# Patient Record
Sex: Female | Born: 1945 | Race: White | Hispanic: No | Marital: Married
Health system: Southern US, Community
[De-identification: ages and names within clinical notes are randomized; demographics above are authoritative.]

---

## 1998-08-25 ENCOUNTER — Ambulatory Visit (HOSPITAL_BASED_OUTPATIENT_CLINIC_OR_DEPARTMENT_OTHER): Admission: RE | Admit: 1998-08-25 | Discharge: 1998-08-25 | Payer: Self-pay | Admitting: Orthopedic Surgery

## 2006-09-02 ENCOUNTER — Inpatient Hospital Stay (HOSPITAL_COMMUNITY): Admission: RE | Admit: 2006-09-02 | Discharge: 2006-09-07 | Payer: Self-pay | Admitting: Orthopedic Surgery

## 2006-11-29 ENCOUNTER — Inpatient Hospital Stay (HOSPITAL_COMMUNITY): Admission: RE | Admit: 2006-11-29 | Discharge: 2006-12-03 | Payer: Self-pay | Admitting: Orthopedic Surgery

## 2007-01-30 ENCOUNTER — Ambulatory Visit (HOSPITAL_COMMUNITY): Admission: RE | Admit: 2007-01-30 | Discharge: 2007-01-31 | Payer: Self-pay | Admitting: Orthopedic Surgery

## 2008-04-12 IMAGING — CR DG CHEST 2V
2 series · 2 of 2 positions shown · non-contrast
Comparison: None.

CLINICAL DATA: Osteoarthritis, left knee.  Preoperative respiratory exam.
 CHEST ? 2 VIEW:

[w chest pa *]
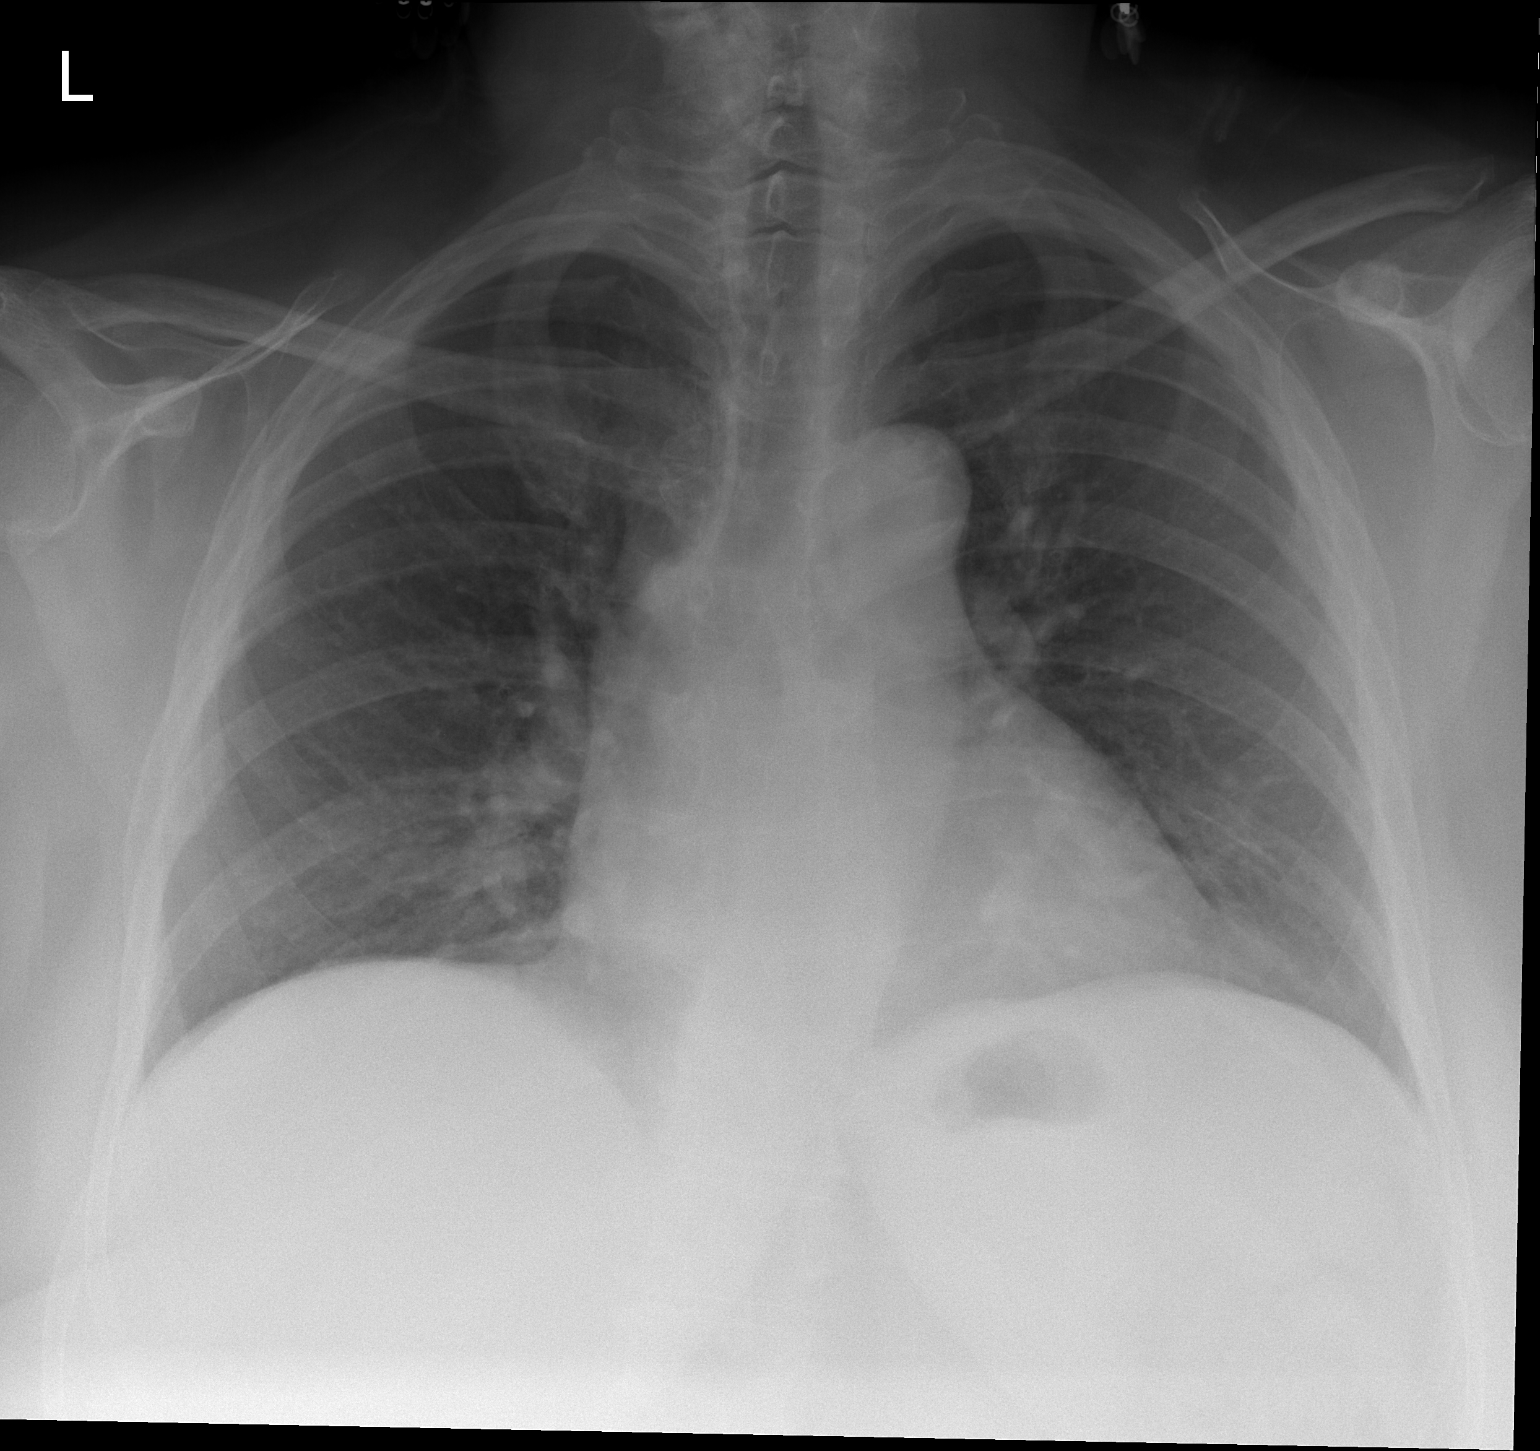

[w chest lat *]
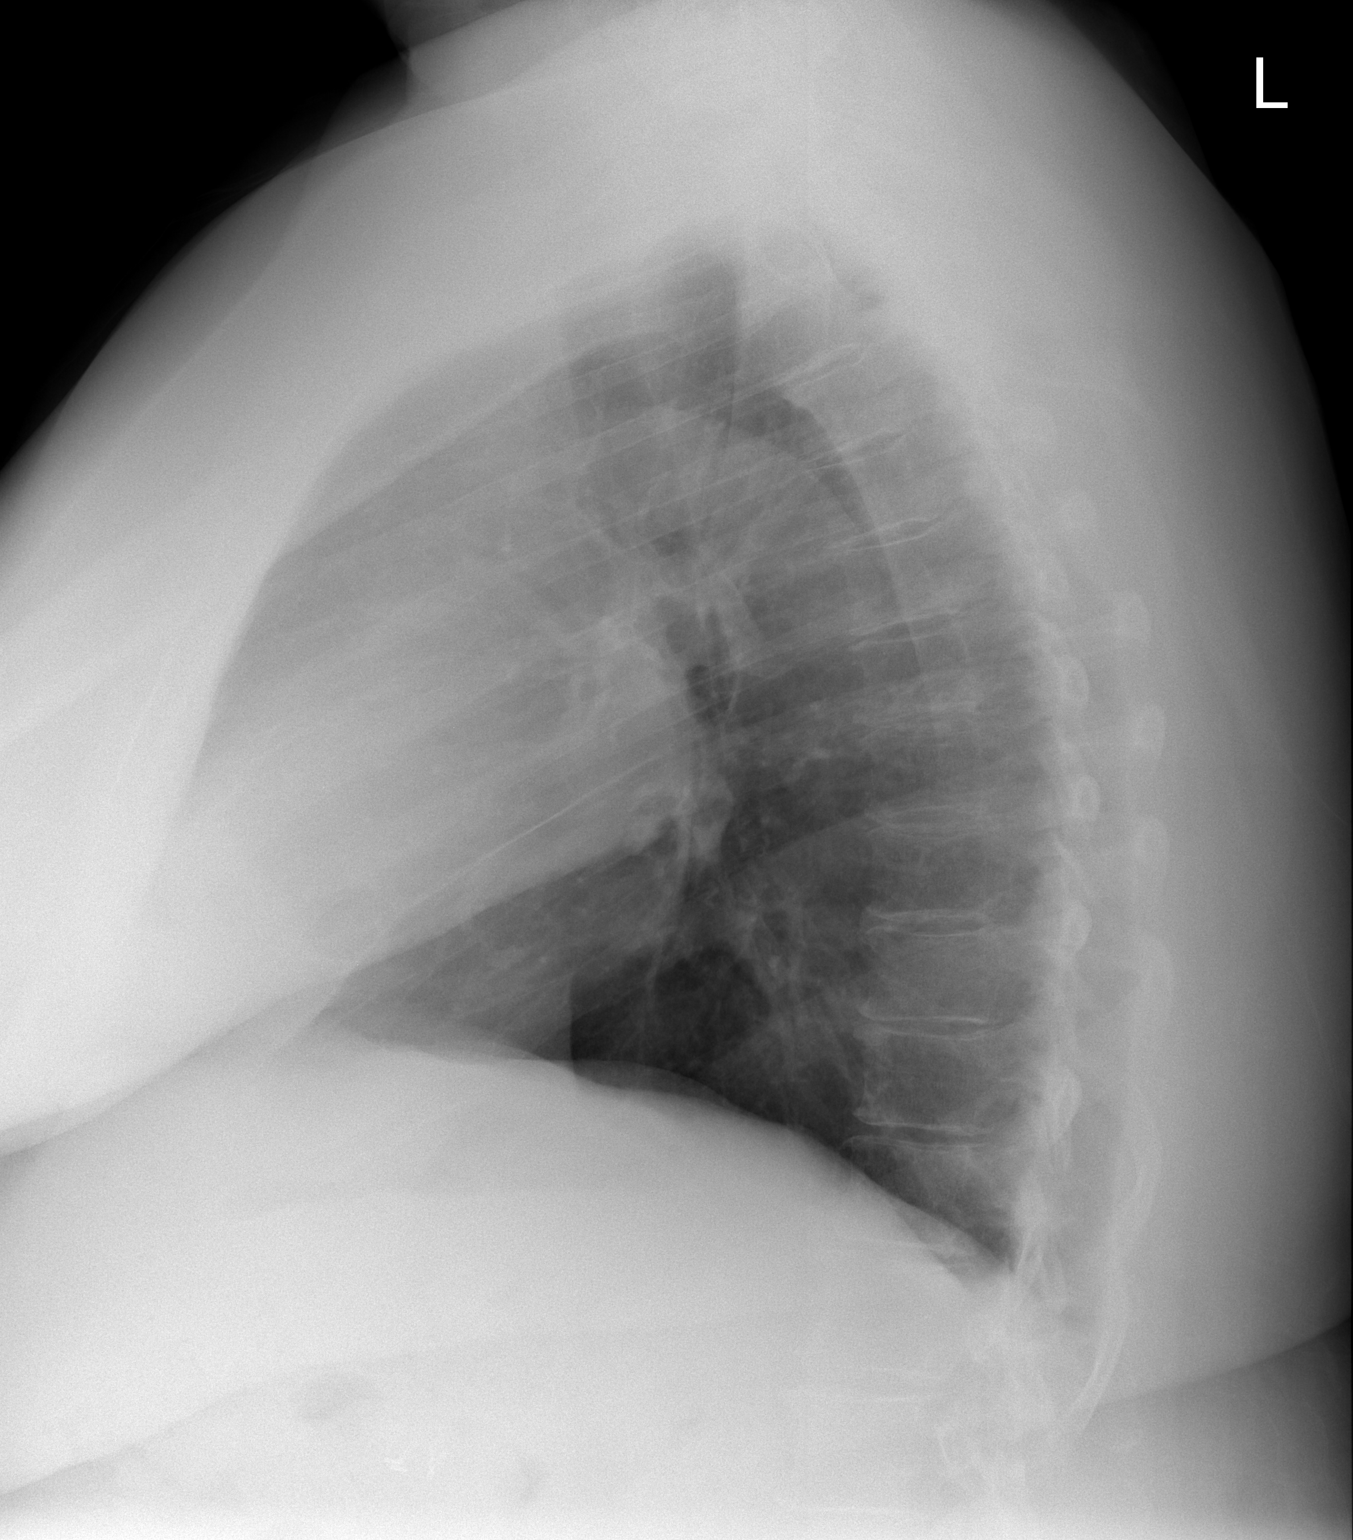

[2 of 2 positions shown; findings below may reference images not displayed]

FINDINGS: Heart size is enlarged.  There is no effusion or edema.  No airspace opacities are identified.  
 The trachea is midline.
IMPRESSION: 1. No active disease.
 2. Cardiac enlargement without failure.

## 2010-11-21 NOTE — H&P (Signed)
NAME:  [name], [name]                 ACCOUNT NO.:  [id number]   MEDICAL RECORD NO.:  [id number]          PATIENT TYPE:  INP   LOCATION:  0001                         FACILITY:  [facility]   PHYSICIAN:  Ollen Gross, M.D.    DATE OF BIRTH:  1945-08-17   DATE OF ADMISSION:  11/29/2006  DATE OF DISCHARGE:                              HISTORY & PHYSICAL   DATE OF OFFICE VISIT, HISTORY AND PHYSICAL:  Nov 14, 2006.   CHIEF COMPLAINT:  Right knee pain.   HISTORY OF PRESENT ILLNESS:  The patient is 65 year old female who been  seen by Dr. Lequita Halt for bilateral knee pain.  She previously underwent a  left total knee arthroplasty back in February of this year.  She has  recovered fairly well from her left knee but her right knee has  continued to progress in pain.  It is felt she would benefit from  undergoing replacement.  Risks and benefits have been discussed.  She  elects to proceed with surgery.   ALLERGIES:  TETRACYCLINE and LIPITOR.   CURRENT MEDICATIONS:  Nexium, Paxil, levothyroxine, clonazepam,  Actos/metformin, methocarbamol, OxyContin, and Lorcet.   PAST MEDICAL HISTORY:  1. Depression.  2. Arthritis.  3. Hiatal hernia.  4. Reflux disease.  5. History of bronchitis.  6. History of UTIs.  7. Hypercholesterolemia.  8. Non-insulin-dependent diabetes mellitus.  9. Hypothyroidism.  10.History of uterine fibroids.  11.History of anemia.  12.History of postop blood loss anemia requiring transfusion from      previous abdominal surgery.  13.History of cervical radiculopathy.  14.History of cystitis.  15.History of fibromyalgia.  16.History of carpal tunnel syndrome.  17.Cervical degenerative disk disease.  18.Lumbar degenerative disk disease.   PAST SURGICAL HISTORY:  1. Left total knee in February 2008 per Dr. Lequita Halt.  2. Appendectomy.  3. Rotator cuff repair.  4. Gallbladder surgery.  5. She has undergone multiple abdominal surgeries including ventral      hernia repair  and the removal of mesh after it broke for a total of      four abdominal procedures.   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  Three  children.   FAMILY HISTORY:  Mother with a history of stroke and diabetes.  Father  with a history of heart disease, blood clots and diabetes.   REVIEW OF SYSTEMS:  GENERAL:  No fevers, chills or night sweats.  NEUROLOGIC:  No seizures, syncope or paralysis.  RESPIRATORY:  No  shortness of breath, productive cough or hemoptysis.  CARDIOVASCULAR:  No chest pain, angina or orthopnea.  GI: History of reflux, hiatal  hernia.  No no nausea, vomiting, diarrhea or constipation.  GU:  History  of UTIs and cystitis.  History of frequency and nocturia.  MUSCULOSKELETAL:  Right knee.   PHYSICAL EXAMINATION:  VITAL SIGNS:  Pulse 74, respirations 14, blood  pressure 140/86.  GENERAL:  A 65 year old white female, well-developed, overweight, obese,  no acute distress, alert and oriented and cooperative.  HEENT:  Normocephalic, atraumatic.  Pupils equal, round and reactive.  Oropharynx clear.  EOMs intact.  NECK:  Supple.  CHEST:  Anterior and posterior chest walls clear.  No rhonchi, rales, or  wheezing.  HEART:  Regular rate and rhythm.  Grade 2/6 systolic ejection murmur,  S1, S2 noted.  ABDOMEN:  Soft, round, abdominal pannus.  Multiple abdominal scars with  ventral hernia.  RECTAL, BREASTS, GENITALIA:  Not done, not pertinent to present illness.  EXTREMITIES:  Right knee:  Range of motion 10-120.  Marked crepitus.  Tender more medial than lateral.   IMPRESSION:  1. Osteoarthritis, right knee.  2. Depression.  3. Arthritis.  4. Hiatal hernia.  5. Reflux disease.  6. History of bronchitis.  7. History of urinary tract infections.  8. Hypercholesterolemia.  9. Non-insulin-dependent diabetes mellitus.  10.Hypothyroidism.  11.History of uterine fibroids.  12.History of anemia.  13.History of blood loss anemia requiring transfusion  following      previous abdominal surgeries.  14.History of cervical radiculopathy.  15.History of cystitis.  16.History of fibromyalgia.  17.History of carpal tunnel syndrome.  18.Cervical degenerative disk disease.  19.Lumbar degenerative disk disease.   PLAN:  The patient will be admitted to [facility] to undergo a  right total knee replacement arthroplasty.  Surgery will be performed by  Dr. Ollen Gross.      Alexzandrew L. Perkins, P.A.C.      Ollen Gross, M.D.  Electronically Signed    ALP/MEDQ  D:  11/28/2006  T:  11/29/2006  Job:  [id number]   cc:   Cathleen Corti. Percell Boston, MD  [facility]  [place]

## 2010-11-21 NOTE — Op Note (Signed)
NAME:  Laprise, Yu                 ACCOUNT NO.:  [id number]   MEDICAL RECORD NO.:  [id number]          PATIENT TYPE:  INP   LOCATION:  0001                         FACILITY:  [facility]   PHYSICIAN:  Ollen Gross, M.D.    DATE OF BIRTH:  March 25, 1946   DATE OF PROCEDURE:  DATE OF DISCHARGE:                               OPERATIVE REPORT   PREOPERATIVE DIAGNOSIS:  Osteoarthritis of the right knee.   POSTOPERATIVE DIAGNOSIS:  Osteoarthritis of the right knee.   PROCEDURE:  Right total knee arthroplasty.   ASSISTANT:  Avel Peace, PA-C.   ANESTHESIA:  General with postop Marcaine pain pump.   ESTIMATED BLOOD LOSS:  Minimal.   DRAINS:  None.   TOURNIQUET TIME:  38 minutes at 350 mmHg.   COMPLICATIONS:  None.   CONDITION:  Stable.   BRIEF CLINICAL NOTE:  Ms. [name] [name] is a 65 year old female with end-stage  arthritis of the right knee.  She has failed nonoperative management.  She had a successful left total knee arthroplasty and presents for right  total knee arthroplasty.   PROCEDURE IN DETAIL:  Successful administration of general anesthetic.  A tourniquet was placed on the right thigh and the right lower extremity  prepped and draped in the usual sterile fashion.  The extremity was  wrapped in Esmarch, the knee flexed and the tourniquet inflated to 350  mmHg.  A midline incision made with a 10-blade through the subcutaneous  tissue to the level of the extensor mechanism.  A fresh blade was used  to make a medial parapatellar arthrotomy.  The soft tissue over the  proximal medial tibia was subperiosteally elevated to the joint line  with the knife and into the semimembranosus bursa with a Cobb elevator.  The soft tissue laterally was elevated with attention being paid to  avoid the patellar tendon on a tibial tubercle.  The patella subluxed  laterally and the knee flexed 90 degrees.  The ACL and PCL were removed.  The drill was used to create a starting hole in the distal femur  and the  canal was thoroughly irrigated.  A 5-degree and right valgus alignment  guide was placed.  Referencing off the posterior condyles, rotation was  marked and a block pinned to remove 10 mm off the distal femur.  A  distal femoral resection was made with an oscillating saw.  A sizing  block was placed; a size 2.5 was most appropriate.  The rotation was  marked at the epicondylar axis.  A size-2.5 cutting block was placed and  the anterior-posterior chamfer cuts made.   The tibia was subluxed forward and the menisci removed.  Extramedullary  tibial alignment guides were placed, referencing proximally at the  medial aspect of the tibial tubercle and distally along the 2nd  metatarsal axis and tibial crest.  The block was pinned to remove 2 mm  off the more deficient lateral side.  A tibial resection was made with  an oscillating saw.  The tibia was a size 2.5 also and the proximal  tibia was repaired with a modular drill and  keel punch for a size 2.5.  The femoral preparation was completed with the intercondylar cut.   A size-2.5, mobile-bearing tibial trial and size-2.5, posterior-  stabilized femoral trial and size-12.5, posterior-stabilizing, rotating-  platform insert trial were placed.  The 12.5 still had a little bit of  laxity and we went to a 15 which allowed for full extension with  excellent varus and valgus balance throughout full range of motion.  The  patella was then everted and the thickness measured to be 21 mm.  Freehand resection was taken to 13 mm, a 35-template was placed, lug  holes were drilled, and a trial patella was placed and it tracked  normally.  Osteophytes were removed off the posterior femur with the  trial in place.  All the trials were removed and the cut bone surfaces  prepared with pulsatile lavage.  The cement was mixed, and once ready  for implantation, the size-2.5, mobile-bearing tibial tray, size-2.5,  posterior-stabilized femur and size-35  patella were cemented into place  and the patella was held a clamp.  A trial 15-mm insert was placed, the  knee held in full extension and all extruded cement removed.  The joint  was then thoroughly irrigated with saline solution.  When the cement is  fully hardened, then the trial was removed and the FloSeal injected onto  the posterior capsule.  The permanent, 15-mm, posterior-stabilizer,  rotating-platform insert was placed.  The knee was then held in full  extension.  The remainder of FloSeal was injected into the suprapatellar  area in the medial and lateral gutters.  The tourniquet was loosened for  a total time of 38 minutes.  Pressure was held for about a minute and  then the knee inspected and minimal bleeding was encountered.  We  thoroughly irrigated again, and then the arthrotomy was closed with  interrupted #-PDS.  Flexion against gravity was 135 degrees.  The subcu  was closed with interrupted 2-0 Vicryl and subcuticular running 4-0  Monocryl.  A catheter for the Marcaine pain pump was placed and the pump  was initiated.  Steri-Strips and a bulky sterile dressing are applied.  She was placed into a knee immobilizer, awakened and transported to  recovery in stable condition.      Ollen Gross, M.D.  Electronically Signed     FA/MEDQ  D:  11/29/2006  T:  11/29/2006  Job:  [id number]

## 2010-11-21 NOTE — Op Note (Signed)
NAME:  [name] [name], [name] [name]                 ACCOUNT NO.:  [id number]   MEDICAL RECORD NO.:  [id number]          PATIENT TYPE:  OIB   LOCATION:  5036                         FACILITY:  [facility]   PHYSICIAN:  Vania Rea. Supple, M.D.  DATE OF BIRTH:  1946-06-11   DATE OF PROCEDURE:  01/30/2007  DATE OF DISCHARGE:                               OPERATIVE REPORT   PREOPERATIVE DIAGNOSES:  1. Chronic large right shoulder rotator cuff tear.  2. Right shoulder acromioclavicular joint arthropathy.   POSTOPERATIVE DIAGNOSES:  1. Chronic massive right shoulder rotator cuff tear.  2. Right shoulder acromioclavicular joint arthropathy.  3. Right shoulder biceps tendon tear.  4. Complex and extensive labral tear.   PROCEDURE:  1. Right shoulder examination under anesthesia.  2. Right shoulder diagnostic arthroscopy.  3. Biceps tendon tenotomy.  4. Labral debridement.  5. Arthroscopic subacromial decompression bursectomy.  6. Arthroscopic distal clavicle resection.  7. Arthroscopic rotator cuff repair.  This performed using a double      row suture bridge repair construct and two bio corkscrew anchors      and two 5-0 swivel lock suture anchors.   SURGEON OF RECORD:  Vania Rea. Supple, M.D.   ASSISTANTFrench Ana Shuford PA-C.   ANESTHESIA:  General endotracheal.   ESTIMATED BLOOD LOSS:  Minimal.   COMPLICATIONS:  None.   HISTORY:  [name] is a 65 year old female who has had chronic right  shoulder pain as well as weakness and restrictions in shoulder mobility  with an MRI scan from a year ago showing a relatively large and  moderately retracted tear of the rotator cuff.  Due to her ongoing pain  and functional limitations, she is brought to the operating at this time  for planned right shoulder arthroscopy with rotator repair as indicated.   We counseled [name] on treatment options as well as risks versus  benefits thereof.  Possible surgical complications including infection,  neurovascular  injury, persistent pain, loss of motion, recurrence  rotator cuff tear and possible need for additional surgery were  reviewed.  She understands and accepts and agrees for planned procedure.   Initially [name] had been scheduled for a hemiarthroplasty the  shoulder due to a presumptive rotator cuff tear arthropathy.  However on  reviewing her history as well as studies and reexamination of the  shoulder, it was felt as though her overall shoulder alignment was  relatively good and that she would be a good candidate for an attempt at  repair of the rotator cuff as opposed to proceeding with a  hemiarthroplasty at this stage.  I reviewed these issues great length  with [name]  preoperatively and she understood and accepted and  agreed with our plan for arthroscopic repair is indicated.   PROCEDURE IN DETAIL:  After undergoing routine preop evaluation, the  patient received prophylactic antibiotics.  Brought to the operating  room, placed supine on the operating table and underwent smooth  induction of a general endotracheal anesthesia.  Turned to the left  lateral decubitus position on the beanbag and appropriately padded and  protected.  Right shoulder examination under anesthesia revealed full  passive motion.  No obvious instability patterns.  Right arm suspended  in 70 degrees of abduction with 10 pounds traction.  The right shoulder  girdle region was then sterilely prepped and draped in standard fashion.  Posterior portal established in the glenohumeral joint and anterior  portal established under direct visualization.  The relatively large  rotator cuff tear made it such that these portals actually visualized  the glenohumeral joint through the cuff tear.  The tear involved the  entire supra and infraspinatus and this was retracted almost 3 cm.  It  was not all the way back to the glenoid, however.  It was quite broad  however, being almost 5 cm in length across the apex  of the greater  tuberosity.  The biceps tendon showed severe degeneration and fraying of  the intra-articular portion and with this finding, we went ahead and  performed a biceps tenotomy dividing the biceps tendon.  We then found a  large degenerative tear of the superior half of the labrum. We performed  a labral debridement.  The articular surfaces were in good condition.  We then mobilized the rotator cuff and performed dissection on both  bursal and articular sides and then confirmed the rotator cuff was  properly mobile and had good elasticity and it did appear to be  repairable injury.  We then performed subacromial bursectomy and lysis  of adhesions and then our bur was introduced to perform subacromial  decompression creating type 1 morphology.  Portal was then established  anterior to the distal clavicle and distal clavicle resection performed  with a bur.  Care was taken to make sure the entire circumference of the  distal clavicle could be visualized to ensure adequate removal of bone.  An accessory portal device was then established.  Through the stab  wounds on lateral margin of the acromion placed an Arthrex bio corkscrew  suture anchor x2 and the free limbs of the suture anchor then passed to  adjacent margin of the rotator cuff in a horizontal mattress construct  and this series of suture limbs was then tied sequentially from  posterior to anterior with a series of sliding locking knots followed by  multiple overhand throws on alternating posts.  This allowed excellent  reapposition of rotator cuff edge against the bony bed tuberosity.  I  should mention that we prepared the tuberosity by removing the residual  soft tissue and then gently abrading the bone.  Once the sutures had  been tied we then created a suture bridge utilizing two Arthrex bio  swivel lock suture anchors passing the suture limbs through these  anchors and nicely compressing the repair and adding  stability.  Suture  limbs were then clipped.  At completion the rotator cuff margin was  nicely compressed against the bony bed on the tuberosity.  This point we  confirmed appropriate decompression and bursectomy.  Fluid and  instruments were removed.  Portals closed with Monocryl and Steri-  Strips.  A bulky dry dressing was then taped to right shoulder and right  arm was placed in sling immobilizer.  The patient then rolled supine,  extubated and taken to the recovery room in stable condition.      Vania Rea. Supple, M.D.  Electronically Signed     KMS/MEDQ  D:  01/30/2007  T:  01/31/2007  Job:  [id number]

## 2010-11-24 NOTE — H&P (Signed)
NAME:  [name] [name], [name] [name]                 ACCOUNT NO.:  [id number]   MEDICAL RECORD NO.:  [id number]          PATIENT TYPE:  INP   LOCATION:  NA                           FACILITY:  [facility]   PHYSICIAN:  Ollen Gross, M.D.    DATE OF BIRTH:  10/19/1945   DATE OF ADMISSION:  09/02/2006  DATE OF DISCHARGE:                              HISTORY & PHYSICAL   Date of office visit, history and physical August 27, 2006.   CHIEF COMPLAINT:  Bilateral knee pain, left greater than right.   HISTORY OF PRESENT ILLNESS:  The patient is a 65 year old female who has  been seen by Dr. Lequita Halt for bilateral knee pain, the left is greater  than the right.  She is seen in second opinion with a longstanding  history of arthritis and pain in both knees.  She was then treated in  the past conservatively including with multiple injections including  cortisone, Synvisc and Hyalgan.  Unfortunately, nothing has helped.  She  has been seen originally in [place] and recommended bilateral total  knee arthroplasty.  She has some good friends in [place] and was  referred over to Dr. Lequita Halt.  She has been seen by Dr. Lequita Halt and  found to have severe end-stage tricompartmental arthritis in the left  knee, bone-on-bone arthritis compartments and in the right knee very  significant arthritis tricompartmental but to a lesser degree.  It is  felt due to the end-stage arthritis and findings that she would benefit  from undergoing knee replacements.  It is felt that due to her ongoing  medical problems that it is much safer to do one at a time.  This is  discussed at length with her.  Risks and benefits have been discussed  and she has elected to proceed with surgery.  She was subsequently  admitted to the hospital.   ALLERGIES:  1. TETRACYCLINE.  2. LIPITOR.   CURRENT MEDICATIONS:  Sulfa, Flexeril, metformin, Actos, clonazepam,  Prevacid, Xanax, Paxil, OxyContin, Benadryl, Mobic, thyroid supplement  and  Lorcet.   PAST MEDICAL HISTORY:  1. Depression.  2. Arthritis.  3. Hiatal hernia.  4. Reflux disease.  5. History of UTIs.  6. Hypercholesterolemia.  7. Noninsulin-dependent diabetes mellitus.  8. Hypothyroidism.  9. History of uterine fibroids.  10.History of anemia.  11.History of postoperative blood loss anemia requiring transfusion      following abdominal surgery.  12.History of cervical radiculopathy.  13.History of cystitis.  14.History of fibromyalgia.  15.History of carpal tunnel syndrome.  16.Cervical degenerative disc disease.  17.Lumbar degenerative disc disease.   PAST SURGICAL HISTORY:  1. Appendectomy.  2. Rotator cuff repair.  3. Gallbladder surgery.  4. She has undergone 4 abdominal surgeries including ventral hernia      repair and then removal of mesh after it broke and has had 4      surgeries total.   SOCIAL HISTORY:  Married, housewife, nonsmoker, no alcohol, 3 children.   FAMILY HISTORY:  Mother with history of stroke and diabetes.  Father  with a history of heart disease, blood clots and diabetes.  REVIEW OF SYSTEMS:  GENERAL:  No fevers, chills, night sweats.  NEUROLOGICAL:  She does have cervical radiculopathy.  No seizures,  syncope, paralysis.  RESPIRATORY:  No shortness of breath, productive  cough or hemoptysis.  CARDIOVASCULAR:  No chest pain, angina or  orthopnea.  GI:  She does have constipation with reflux disease.  No  nausea, vomiting, diarrhea.  GU:  History of UTIs, cystitis, some  urinary frequency and nocturia.  MUSCULOSKELETAL:  Bilateral knees.   PHYSICAL EXAMINATION:  VITAL SIGNS:  Pulse 92, respirations 14, blood  pressure 162/84.  GENERAL:  A 65 year old white female well-developed, well-nourished,  overweight, obese, in no acute distress in no acute distress.  She is  anxious at time of exam, pleasant.  Appears to be a good historian.  HEENT:  Normocephalic and atraumatic.  Pupils round and reactive.  Oropharynx clear.   EOMs intact.  Neck is supple.  No bruits.  CHEST:  Clear anterior and posterior chest walls.  No rhonchi, rales or  wheezing.  HEART:  Regular rate and rhythm.  A grade 2/6 systolic ejection murmur.  S1-S2 noted.  ABDOMEN:  Soft, large abdominal pannus.  Multiple anterior scars with a  ventral hernia.  RECTAL/BREASTS/GENITALIA:  Not done, not pertinent to present illness.  EXTREMITIES:  Left greater than right knee.  Left knee shows range of  motion of 10-120 passively, marked crepitus is noted.  Tender more  medial than lateral.  Right knee shows range of motion of 5-120, marked  crepitus noted.  Tender more medial than lateral.   IMPRESSION:  1. Bilateral knees osteoarthritis, left greater than right.  2. Depression.  3. Arthritis.  4. Hiatal hernia.  5. Reflux disease.  6. History of urinary tract infections.  7. Hypercholesterolemia.  8. Noninsulin-dependent diabetes mellitus.  9. Hypothyroidism.  10.History of uterine fibroids.  11.History of anemia.  12.History of postoperative blood loss anemia requiring transfusing      following one of the abdominal surgeries.  13.History of cervical radiculopathy.  14.History of cystitis.  15.History of fibromyalgia.  16.History of carpal tunnel syndrome.  17.Cervical degenerative disc disease.  18.Lumbar degenerative disc disease.   PLAN:  The patient is admitted to [facility] to undergo left  total knee replacement arthroplasty.  Surgery will be performed by Dr.  Ollen Gross.      Alexzandrew L. Julien Girt, P.A.      Ollen Gross, M.D.  Electronically Signed    ALP/MEDQ  D:  09/01/2006  T:  09/01/2006  Job:  [id number]   cc:   Ollen Gross, M.D.  Fax: [phone]   Leanora Cover, M.D.  [facility] in [place], [place]

## 2010-11-24 NOTE — Op Note (Signed)
NAME:  [name] [name], [name] [name]                 ACCOUNT NO.:  [id number]   MEDICAL RECORD NO.:  [id number]          PATIENT TYPE:  INP   LOCATION:  0011                         FACILITY:  [facility]   PHYSICIAN:  Ollen Gross, M.D.    DATE OF BIRTH:  [birth year]   DATE OF PROCEDURE:  09/02/2006  DATE OF DISCHARGE:                               OPERATIVE REPORT   PREOPERATIVE DIAGNOSIS:  Osteoarthritis, left knee.   POSTOPERATIVE DIAGNOSIS:  Osteoarthritis, left knee.   PROCEDURE:  Left total knee arthroplasty, computer navigated.   SURGEON:  Ollen Gross, M.D.   ASSISTANT:  Alexzandrew L. Perkins, P.A.-C.   ANESTHESIA:  General with postoperative Marcaine pain pump.   BLOOD LOSS:  300.   DRAINS:  Hemovac x1.   TOURNIQUET TIME:  Fifty-five minutes at 300 mmHg.   COMPLICATIONS:  None.   CONDITION:  Stable to the recovery room.   BRIEF CLINICAL NOTE:  [name] [name] is a 65 year old female with end-stage  osteoarthritis of both knees, left more symptomatic than the right.  She  has had a previous tibial fracture on the left with some angular  deformity just above the ankle.  For this reason, we decided to do this  via computer navigation.  She presents now for a total knee  arthroplasty.   PROCEDURE IN DETAIL:  After successful administration of a general  anesthetic, a tourniquet is placed high on her left thigh, and her left  lower extremity prepped and draped in the usual sterile fashion.  The  extremity is wrapped in esmarch, the knee flexed, and the tourniquet  inflated to 300 mmHg.  A midline incision was made with a 10 blade  through the subcutaneous tissue to the level of the extensor mechanism.  A fresh blade is used to make a medial parapatellar arthrotomy.  The  soft tissue over the proximal medial tibia is subperiosteally elevated  to the joint line with the knife into the semimembranosus bursa with a  Cobb elevator.  The soft tissue laterally is elevated with attention  being  paid to avoid the patellar tendon on the tibial tubercle.  The  patella is subluxed laterally, and the knee flexed to 90 degrees.  Intercondylar osteophytes removed, then PCL is taken out.  ACL was  already gone.  The 4 mm Shantz screws are then placed, two in the femur  and two in the tibia for the placement of the computerized arrays.  Anatomic data is then inputted into the computer for generation of the  tibial and femoral models.  Alignment is about 1 degree varus with a 5  degree flexion contracture.   The distal femoral cutting block is then placed under computer guidance  and placed to resect 10 mm off the distal femur, neutral varus-valgus,  neutral flexion/extension.  The resection is made with an oscillating  saw.  Verification devices placed.  It confirms that the cut was made as  planned.  The sizing block was placed, and it is a size 3, which  corresponds with the computer-generated size.  A rotation was based off  the  epicondylar axis and confirmed by the computer.  A size 3 cutting  block is then placed, and the anterior, posterior, and chamfer cuts are  made.   The tibia is subluxed forward, and the menisci are removed.  The tibial  cutting guide is then placed to resect about 10 mm off the nondeficient  lateral side with neutral varus valgus, 2 degrees posterior slope.  The  resection is made with an oscillating saw.  Verification device confirms  resection is made as planned.  A size 3 is the most appropriate tibial  component.  The proximal tibia is prepared with the modular drill and  keel punch for a size 3, and femoral preparation is completed with the  intercondylar cut.   A size 3 mobile-bearing tibial trial, a size 3 posterior stabilized  femoral trial, and a 10 mm posterior stabilized rotating platform insert  trial was placed.  We had to go up to a 15 to get excellent balance,  varus valgus, with full extension.  The computer confirms normal  alignment with  no flexion contracture and with excellent balance gaps,  both in flexion of 90 degrees and full extension.  We then everted the  patella and removed all of the computer pins.  The thickness of the  patella is 21 mm.  Free-hand resection is taken to 12 mm.  A 35 template  is placed.  Lug holes are drilled.  The trial patella is placed, and it  tracks normally.  Osteophytes off the posterior femur with the trial in  place.  All trials were removed, and the cut-bone surfaces are prepared  with pulsatile lavage.  Cement is mixed, and once ready for  implantation, a size 3 mobile-bearing tibial and size 3 posterior  stabilized femur, and 35 patella are cemented into place.  The patella  is held with a clamp.  The trial 15 mm insert is placed.  The knee held  in full extension, and all extruded cement is removed.  Once the cement  is fully hardened, then the permanent 15 mm posterior stabilized  rotating platform insert is placed into the tibial tray.  The wound is  copiously irrigated with saline solution.  The tourniquet released for a  total time of 55 minutes.  Minor bleeding stopped with cautery.  An  arthrotomy is closed over a Hemovac drain with interrupted #1 PDS.  Flexion against gravity is about 115 degrees, at which points the calf  and posterior thigh are approximated to each other.  The subcu is closed  in interrupted 2-0 Vicryl and the subcuticular with running 4-0  Monocryl.  The catheter for the Marcaine pain pump is placed, and the  pump is initiated.  Drains hooked to suction, and Steri-Strips and a  bulky sterile dressing applied.  She is then placed into a knee  immobilizer, awakened, and transported to recovery in stable condition.      Ollen Gross, M.D.  Electronically Signed     FA/MEDQ  D:  09/02/2006  T:  09/03/2006  Job:  [id number]

## 2010-11-24 NOTE — Discharge Summary (Signed)
NAME:  [name], [name]                 ACCOUNT NO.:  [id number]   MEDICAL RECORD NO.:  [id number]          PATIENT TYPE:  INP   LOCATION:  1613                         FACILITY:  [facility]   PHYSICIAN:  Ollen Gross, M.D.    DATE OF BIRTH:  1945/09/21   DATE OF ADMISSION:  11/29/2006  DATE OF DISCHARGE:  12/03/2006                               DISCHARGE SUMMARY   ADMITTING DIAGNOSES:  1. Osteoarthritis, right knee.  2. Depression.  3. Arthritis.  4. Hiatal hernia.  5. Reflux disease.  6. History of bronchitis.  7. History of urinary tract infections.  8. Hypercholesterolemia.  9. Non-insulin-dependent diabetes mellitus.  10.Hypothyroidism.  11.History of uterine fibroids.  12.History of anemia.  13.History of blood loss anemia requiring transfusions from previous      abdominal surgeries.  14.History of cervical radiculopathy.  15.History of cystitis.  16.History of fibromyalgia  17.History of carpal spinal syndrome.  18.Cervical degenerative disk disease.  19.Lumbar degenerative disk disease.  20.Obesity.   DISCHARGE DIAGNOSES:  1. Osteoarthritis, right knee, status post right total knee      replacement arthroplasty.  2. Acute postop blood loss anemia.  3. Status post transfusion without sequelae.  4. Depression.  5. Arthritis.  6. Hiatal hernia.  7. Reflux disease.  8. History of bronchitis.  9. History of urinary tract infections.  10.Hypercholesterolemia.  11.Non-insulin-dependent diabetes mellitus.  12.Hypothyroidism.  13.History of uterine fibroids.  14.History of anemia.  15.History of blood loss anemia requiring transfusions from previous      abdominal surgeries.  16.History of cervical radiculopathy.  17.History of cystitis.  18.History of fibromyalgia  19.History of carpal spinal syndrome.  20.Cervical degenerative disk disease.  21.Lumbar degenerative disk disease.  22.Obesity.   PROCEDURE:  Nov 29, 2006, right total knee.  Surgeon:  Dr. Lequita Halt.  Assistant:  Avel Peace, PA-C.  Anesthesia:  General with postop  Marcaine pain pump.   CONSULTATIONS:  None.   BRIEF HISTORY:  [name] [name] is a 65 year old female with end-stage  arthritis of the right knee.  She has failed operative management and  has undergone a successful left total knee, now presents for right total  knee.   LABORATORY DATA:  Preop CBC showed a hemoglobin 11.8, hematocrit 35.1,  white cell count 5.9.  Postop hemoglobin down to 10, drifted down to 9.4  and then 8.8, given blood.  Post-transfusion hemoglobin back up to 10.13  with hematocrit of 28.9.  PT/PTT preop 13.0 and 29, respectively, INR  1.0.  Serial pro times followed.  Last noted PT/INR 29.0/2.6.  Chemistry  panel on admission:  Elevated glucose of 178, known diabetic.  Creatinine slightly elevated at 1.54.  total bilirubin low at 0.2.  Serial BMETs were followed.  Sodium did drop down a little bit from 139  to 134, back up to 139.  Remainder of electrolytes remained within  normal limits.  Preop UA:  Positive nitrite, large leukocyte esterase,  many epithelials, 11-20 white cells, few bacteria, large bilirubin,  trace ketones.  Treated preoperatively.  Blood group type O negative.   HOSPITAL COURSE:  The  patient admitted to [facility],  tolerated the procedure well, later transferred to the recovery room and  then the orthopedic floor.  Started on PCA and p.o. analgesic for pain  control following surgery.  Was doing much better on day #1, a little  sedated but much better appearance from her previous hospitalization.  Started getting up out of bed, slowly progressed with physical therapy  in the beginning, started to be weaned over to p.o. medications.  By day  #2 she was much more comfortable, just a little sedation.  Pain pump was  removed.  Dressing was changed.  Incision looked good.  Hemoglobin was  9.4.  She was asymptomatic.  We discontinued the PCA and weaned over to  p.o.  medications.  Starting to get up with PT and she was still  requiring a fair amount of assistance, maximum assist, only doing about  6 feet and then later that afternoon about 15-17 feet.  By day #3 she  was a little sedated on morning of day three.  Her blood count was a  little low, down to 8.8.  With this, recommended blood transfusion.  She  did receive 2 units of blood and tolerated the blood well.  Hemoglobin  came back up to 10.  Once she felt better after the blood and weaned  over to p.m. medications, she actually did pretty well and was  ambulating about 50 feet.  Continued to improve and was sent home later  that afternoon on the following day of Dec 03, 2006.   DISCHARGE/PLAN:  1. The patient was discharged home on Dec 03, 2006.  2. Discharge diagnoses:  Please see above.  3. Discharge medications:  Vicodin, Robaxin, Coumadin.  4. Diet:  Diabetic heart-healthy diet.  5. Follow-up:  2 weeks.  6. Weightbearing as tolerated.  Total knee protocol.  Home health PT      and home health nursing.   DISPOSITION:  Home.   CONDITION ON DISCHARGE:  Improving.      Alexzandrew L. Perkins, P.A.C.      Ollen Gross, M.D.  Electronically Signed    ALP/MEDQ  D:  01/03/2007  T:  01/03/2007  Job:  [id number]   cc:   Leanora Cover, MD  [facility]  [place], [place]

## 2010-11-24 NOTE — Discharge Summary (Signed)
NAME:  [name] [name], [name] [name]                 ACCOUNT NO.:  [id number]   MEDICAL RECORD NO.:  [id number]          PATIENT TYPE:  INP   LOCATION:  1519                         [facility]ACILITY:  [facility]   PHYSICIAN:  Ollen Gross, M.D.    DATE OF BIRTH:  [birth year]   DATE OF ADMISSION:  09/02/2006  DATE OF DISCHARGE:  09/07/2006                               DISCHARGE SUMMARY   ADMISSION DIAGNOSES:  1. Bilateral knees osteoarthritis, left greater than right.  2. Depression.  3. Arthritis.  4. Hiatal hernia.  5. Reflux disease.  6. History of urinary tract infection.  7. Hypercholesterolemia.  8. Non-insulin-dependent diabetes mellitus.  9. Hypothyroidism.  10.History of uterine fibroids.  11.History of anemia.  12.Previous history of postop blood loss anemia requiring transfusion      following one of her abdominal surgeries.  13.History of cervical radiculopathy.  14, History of cystitis.  1. History of fibromyalgia.  2. History of carpal tunnel syndrome.  3. Cervical degenerative disk disease.  4. Lumbar degenerative disk disease.   DISCHARGE DIAGNOSIS:  1. Osteoarthritis left knee status post left total knee replacement      arthroplasty computer navigation assisted.  2. Postoperative blood loss anemia.  3. Status post transfusion without sequelae.  4. Postoperative hyperkalemia resolved.  5. Postoperative hyponatremia improved.  6. Preoperative acute renal insufficiency with postoperative      exacerbation.  7. Postoperative azotemia improved.  8. Preoperative urinary tract infection treated.  9. Postoperative sedation likely due to narcotics improved. Remaining      discharge diagnoses same as admission diagnoses.   PROCEDURE:  September 02, 2006 left total knee arthroplasty computer  navigation assisted.  Surgeon Dr. Lequita Halt.  Dr. Julien Girt PA-C.  Anesthesia general.  Postop Marcaine pain pump.  Time 55 minutes.  Consults none.   BRIEF HISTORY:  [name] [name] is a 65 year old female  with end-stage  arthritis of both knees, left is more symptomatic than right.  She has  previous had a tibia fracture left with some angular deformity just  above the ankle. This reason elected to do the computer navigation  procedure, now presents for total knee arthroplasty.   LABORATORY DATA:  Preoperative CBC showed a hemoglobin 12.0, hematocrit  34.7, white cell count 5.4. Postop hemoglobin 10.8 down to 8.3.  She was  given blood.  Post transfusion hemoglobin back up to 9.7 and 27.7.  PT/PTT preop 12.9 and 28 respectively.  INR 1.0.  Serial protimes  followed.  Last PT/INR 26.1 and 2.3.  Chem panel on admission; elevated  glucose of 141, elevated BUN, creatinine preop with some probably  chronic renal insufficiency with BUN of 35 and creatinine 1.28,  remaining Chem panel within normal limits.  Serial BMETS followed with  regards to the creatinine.  Did worsen postoperatively up to a level of  1.64 but did improve down to 1.0, BUN went up to 37 then came down to  normal level of 16. Potassium was noted to be hyperkalemic postop and  got up to 6.3 but did improve, came back down to normal level 4.3.  The  sodium went from 141 down to 129 but came back up to a level of 139.  Preop UA cloudy, small bilirubin, trace ketones, positive nitrite,  moderate leukocyte esterase with 11-20 white cells.  This was treated  preoperatively. Follow-up UA only small leukocyte esterase, 7-10 white  cells, 0-2 red cells and trace hemoglobin, small bilirubin, rare  bacteria.  Blood group type O negative.  Urine culture follow-up no  growth.   EKG August 28, 2005; normal sinus rhythm, normal EKG confirmed by  Corky Crafts, M.D.  Follow-up EKG September 03, 2006 normal sinus  rhythm when compared, rate has increased confirmed by Jonelle Sidle, M.D.  Two-view chest August 28, 2006; no active disease.  Cardiac enlargement without failure.   HOSPITAL COURSE:  The patient was admitted to  [facility]  tolerated procedure well, later transferred to the recovery room on  orthopedic floor.  Started on PCA and p.o. analgesics for pain control  following surgery. She was on chronic pain medications of OxyContin this  was renewed. Home meds were renewed however, looking back the  levothyroxine for hypothyroidism was not put on her medical record  sheet. This was resumed on postop day #1. She was known diabetic and on  metformin. She was placed on a sliding scale.  We initially held the  metformin because of the renal insufficiency which was noted  preoperatively.  This actually increased and was exacerbated a little  bit with increasing BUN and creatinine. Postop we gave her some fluids.  She had elevation in her potassium to hyperkalemic state, questionable  whether this was some hemolysis. We repeated K, was noted to be high.  She had a preop UTI which was treated with Cipro.  We are going to  recheck the UA before the Foley was removed. She would have moderate  pain using PCA and then encouraged p.o. for better control. By day two  she was still fairly miserable with pain but although she was a little  drowsy the PCA had been removed on the evening of day one because of  oversedation. Her O2 sats kept dropping. This was probably in lieu of  chronic medications of OxyContin with an acute postop IV narcotics with  the PCA.  She did have some itching so we did some Benadryl. She had  been given fluids because of the renal insufficiency with some mild  exacerbation. By day two she still had a little bit of elevation in her  creatinine. She had decent output but she had high-volume positive  volume on board so we tweaked with a little Lasix to increase her  output. Hyperkalemia had improved down to 4.9 due to giving her fluids  postop because of the above stated problems.  Her sodium of drop down to  129 felt to be due to a dilutional.  I was hoping that her output  would increase and would resolve itself. Continued to moderate very closely.  On day two the dressing was changed.  Incision looked good.  She was  very slow to progress with physical therapy and was having a hard time  max assist. By day three she was up in the chair on the morning rounds.  She had slept up in the chair on the evening of day two.  Unfortunately  hemoglobin had drifted down to a level of 8.3.  She was tired. Felt that  she would best be served by undergoing transfusion.  She was given two  units of blood following discussion. The noted azotemia with elevated  BUN, creatinine had improved.  The creatinine was down to a level of  1.29.  The follow-up UA was ordered once her renal function improved.  She was started back on her metformin and once she was eating and  drinking well.  Incision continued to look good.  Her hyperkalemia which  was noted be postop had resolved, her output had increased and the  positive volume was declining. We left her Foley in an extra day because  of the blood and strict monitoring of I&O's. Her CBG's improved.  There  were elevated initially but they came back down. By postop day #4 she  was still very groggy. This was felt to be due to possibly her chronic  meds versus some of the re-release, felt that some of her IV narcotics  had been absorbed in the subcutaneous tissues and she was getting some  re-releasing of the medications back into her system. Continued oxygen  support. She did have a hyperglycemic episode which was treated but  never lost consciousness. She was given some of the oral tabs and did  well with this.  Serum glucose came right back up. Continue the oxygen  support and monitored her hypokalemia which was still stable with a  resolved.  Her azotemia had improved. UA was questionable with small  leukocytes and 7-10 white cells, therefore we put her back on three more  days of Cipro. Her CBG's had improved better, still  slightly elevated so  we decreased evening doses. Hemoglobin come back up after 9.7. Due to  the continued sedation we reduced the dose of the OxyContin on her  chronic pain medications to try to help alleviate some of the over  sedation. I think this was in lieu of again some of the acute short-term  narcotics we used postoperatively and having some of this released back  into her system. She also had complaints of right shoulder pain.  She  had known significant shoulder problems.  This was discussed with Dr.  Lequita Halt. It was felt her shoulder was slowing her down because it was  difficult to get up and ambulate. Therefore she underwent a cortisone  injection on September 06, 2006 on the bedside procedure utilizing  sterile technique.  The patient underwent a right shoulder subacromial  cortisone injection without difficulty utilizing lidocaine and Depo. Did  discuss with the patient the staff that her sugars probably would  increase a little bit more after the steroid injection and to monitor very closely. By this time her therapy started to improve and she was  getting up and walking about 40 feet.  She was feeling better.  She was  more alert with discontinuation of the IV narcotics and reduction of the  dose of the OxyContin. She was able to participate better with therapy  with less sedation. By the following day September 07, 2006, she had been  seen in rounds by weekend coverage for Dr. Lequita Halt. The patient was  doing well, much more alert.  She was feeling better and states she  wanted to go home.  She is progressing with physical therapy. She was  ambulating approximately 100 feet only supervised. She had vastly  improved over the past 24-36 hours. She did well with PT and was  discharged home.   DISCHARGE/PLAN:  1. The patient was discharged home on September 07, 2006.  2. Discharge diagnoses please see above.  3. Discharge  meds:  Lorcet, Robaxin, Coumadin.   DIET:  Heart healthy  diabetic diet.   ACTIVITY:  Weightbearing as tolerated.  Total knee protocol, PT home  health nursing. Follow-up with Dr. Lequita Halt in office 2 weeks from  surgery, contact the office for an appointment.   DISPOSITION:  Home.   CONDITION ON DISCHARGE:  Slowly improving.      Alexzandrew L. Julien Girt, P.A.      Ollen Gross, M.D.  Electronically Signed    ALP/MEDQ  D:  10/08/2006  T:  10/08/2006  Job:  [id number]   cc:   Leanora Cover, M.D. [facility]  [place],

## 2011-04-23 LAB — DIFFERENTIAL
Basophils Absolute: 0
Eosinophils Absolute: 0.3
Eosinophils Relative: 4
Lymphocytes Relative: 31
Monocytes Relative: 9
Neutrophils Relative %: 57

## 2011-04-23 LAB — URINALYSIS, ROUTINE W REFLEX MICROSCOPIC
Glucose, UA: NEGATIVE
Ketones, ur: 15 — AB
Nitrite: POSITIVE — AB
Protein, ur: NEGATIVE
Urobilinogen, UA: 0.2
pH: 5
pH: 5

## 2011-04-23 LAB — COMPREHENSIVE METABOLIC PANEL
ALT: 14
Albumin: 4.1
Alkaline Phosphatase: 80
Calcium: 9.4
Chloride: 104
Creatinine, Ser: 1.41 — ABNORMAL HIGH
Potassium: 4.6

## 2011-04-23 LAB — APTT: aPTT: 27

## 2011-04-23 LAB — URINE MICROSCOPIC-ADD ON

## 2011-04-23 LAB — CBC
Hemoglobin: 11.9 — ABNORMAL LOW
RDW: 15.6 — ABNORMAL HIGH

## 2011-04-23 LAB — PROTIME-INR: INR: 0.9

## 2013-07-09 DEATH — deceased

## 2013-07-10 ENCOUNTER — Telehealth: Payer: Self-pay

## 2013-07-10 NOTE — Telephone Encounter (Signed)
[facility] [employer]
